# Patient Record
Sex: Female | Born: 2000 | Race: Black or African American | Hispanic: No | Marital: Single
Health system: Southern US, Academic
[De-identification: ages and names within clinical notes are randomized; demographics above are authoritative.]

## PROBLEM LIST (undated history)

## (undated) ENCOUNTER — Ambulatory Visit: Payer: PRIVATE HEALTH INSURANCE

## (undated) ENCOUNTER — Encounter

## (undated) ENCOUNTER — Ambulatory Visit

## (undated) ENCOUNTER — Telehealth

## (undated) ENCOUNTER — Ambulatory Visit: Payer: PRIVATE HEALTH INSURANCE | Attending: Family | Primary: Family

## (undated) ENCOUNTER — Ambulatory Visit: Attending: Pharmacist | Primary: Pharmacist

## (undated) ENCOUNTER — Ambulatory Visit: Attending: Internal Medicine | Primary: Internal Medicine

## (undated) DIAGNOSIS — I889 Nonspecific lymphadenitis, unspecified: Secondary | ICD-10-CM

## (undated) DIAGNOSIS — J45909 Unspecified asthma, uncomplicated: Secondary | ICD-10-CM

## (undated) DIAGNOSIS — L309 Dermatitis, unspecified: Secondary | ICD-10-CM

## (undated) DIAGNOSIS — M359 Systemic involvement of connective tissue, unspecified: Secondary | ICD-10-CM

## (undated) DIAGNOSIS — R519 Headache, unspecified: Secondary | ICD-10-CM

## (undated) HISTORY — DX: Headache, unspecified: R51.9

## (undated) MED ORDER — MOMETASONE 0.1 % TOPICAL OINTMENT: g | 11 refills | 0 days | Status: CN

## (undated) MED ORDER — ALBUTEROL SULFATE HFA 90 MCG/ACTUATION AEROSOL INHALER: 2 | 0 refills | 0 days

## (undated) MED ORDER — EPINEPHRINE 0.3 MG/0.3 ML INJECTION, AUTO-INJECTOR: 0 mg | each | Freq: Once | 2 refills | 2 days

---
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
## 2021-01-04 MED FILL — EMGALITY PEN 120 MG/ML SUBCUTANEOUS PEN INJECTOR: SUBCUTANEOUS | 28 days supply | Qty: 2 | Fill #0

## 2021-02-24 DIAGNOSIS — L209 Atopic dermatitis, unspecified: Principal | ICD-10-CM

## 2021-02-24 MED ORDER — TRIAMCINOLONE ACETONIDE 0.1 % TOPICAL OINTMENT
Freq: Two times a day (BID) | TOPICAL | 2 refills | 0.00000 days
Start: 2021-02-24 — End: ?

## 2021-02-24 MED ORDER — CLOBETASOL 0.05 % TOPICAL OINTMENT
Freq: Two times a day (BID) | TOPICAL | 6 refills | 0.00000 days
Start: 2021-02-24 — End: 2021-03-26

## 2021-02-24 MED ORDER — MOMETASONE 0.1 % TOPICAL OINTMENT
1 refills | 0 days
Start: 2021-02-24 — End: ?

## 2021-02-24 MED ORDER — EUCRISA 2 % TOPICAL OINTMENT
5 refills | 0.00000 days
Start: 2021-02-24 — End: ?

## 2021-02-27 MED ORDER — TRIAMCINOLONE ACETONIDE 0.1 % TOPICAL OINTMENT
Freq: Two times a day (BID) | TOPICAL | 2 refills | 0.00000 days
Start: 2021-02-27 — End: ?

## 2021-02-27 MED ORDER — EUCRISA 2 % TOPICAL OINTMENT
5 refills | 0.00000 days
Start: 2021-02-27 — End: ?

## 2021-02-27 MED ORDER — MOMETASONE 0.1 % TOPICAL OINTMENT
1 refills | 0 days
Start: 2021-02-27 — End: ?

## 2021-02-27 MED FILL — SUMATRIPTAN 100 MG TABLET: ORAL | 5 days supply | Qty: 10 | Fill #1

## 2021-02-27 MED FILL — EMGALITY PEN 120 MG/ML SUBCUTANEOUS PEN INJECTOR: SUBCUTANEOUS | 28 days supply | Qty: 1 | Fill #0

## 2021-02-28 IMAGING — CT CT HEAD W/O CM
4 series · 16 of 47 positions shown, 18 images · non-contrast
Comparison: None.

CLINICAL DATA: Altered mental status.

EXAM:
CT HEAD WITHOUT CONTRAST
TECHNIQUE: Contiguous axial images were obtained from the base of the skull
through the vertex without intravenous contrast.

[Series 3: head without · axial · non-contrast · 0.40mm/px · z∈[+1260,+1380]mm · 7 of 34 slices shown, 9 images]
[im 5/34  brain]
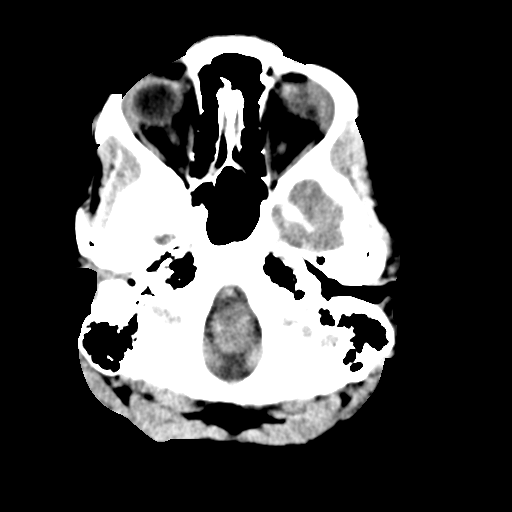
[im 5/34  bone]
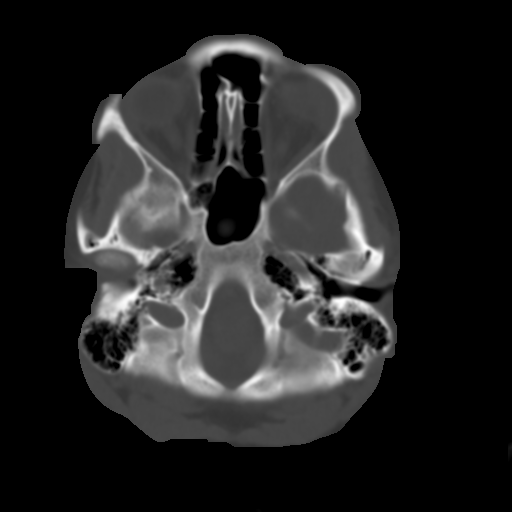
[im 9/34  brain]
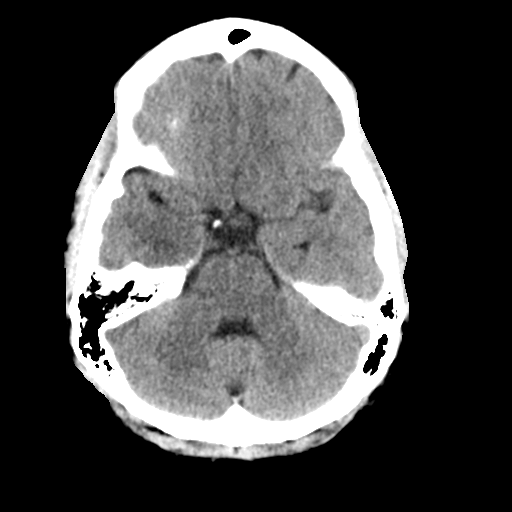
[im 13/34  brain]
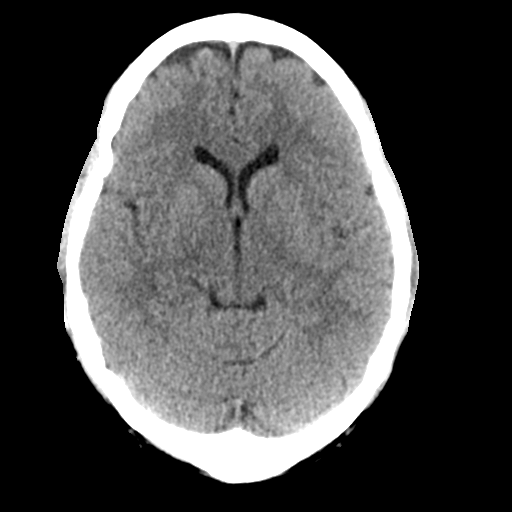
[im 17/34  brain]
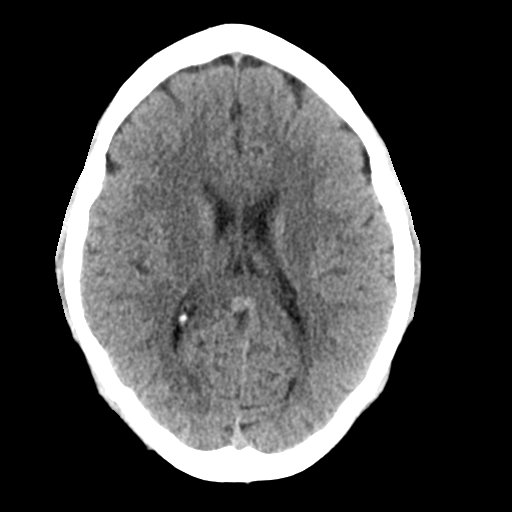
[im 21/34  brain]
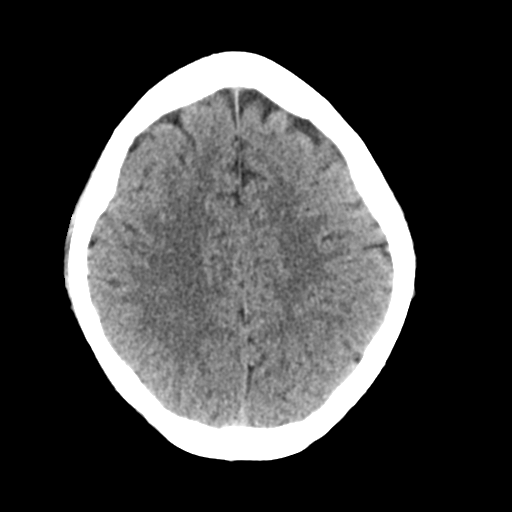
[im 21/34  bone]
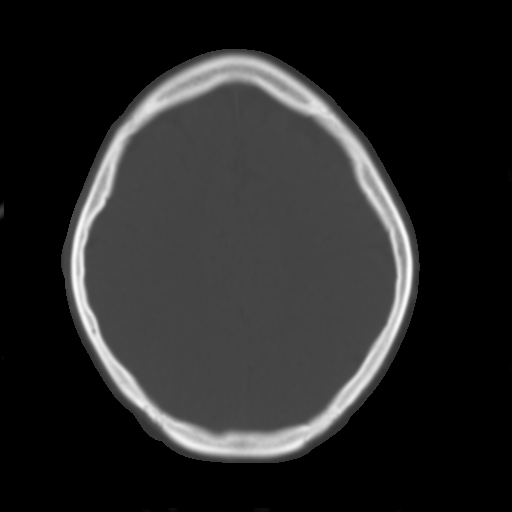
[im 25/34  brain]
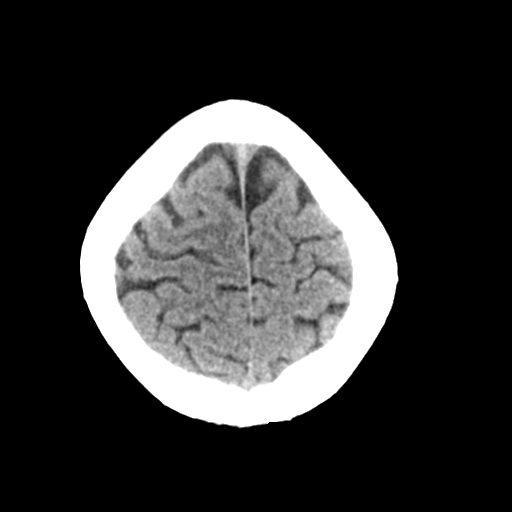
[im 29/34  brain]
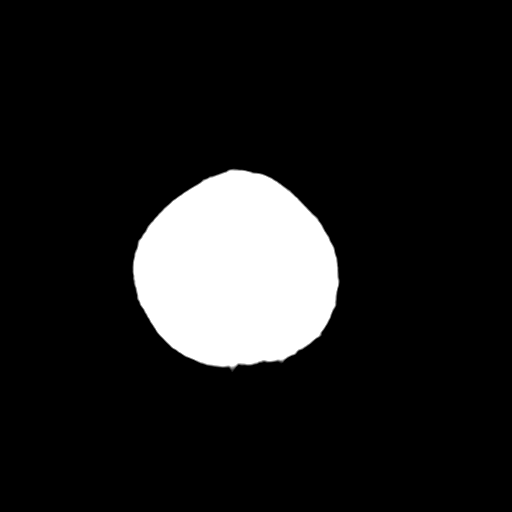

[Series 4: head bone · axial · 0.40mm/px · z∈[+1256,+1288]mm · 3 of 84 slices shown]
[im 9/84  bone]
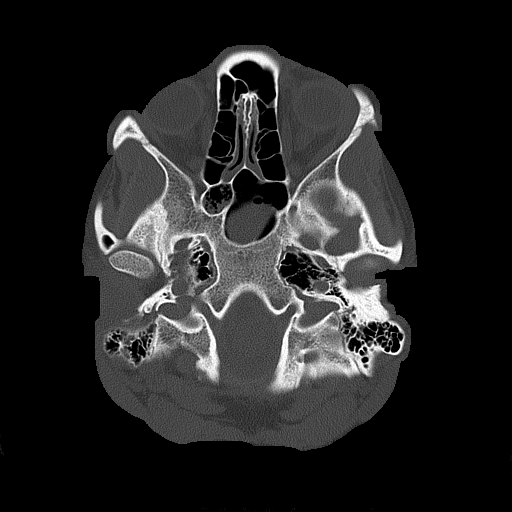
[im 17/84  bone]
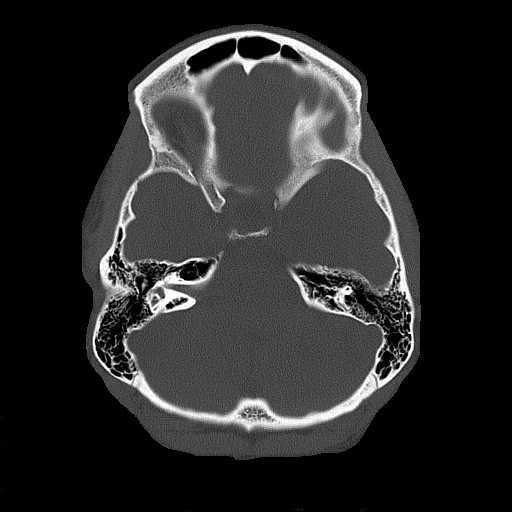
[im 25/84  bone]
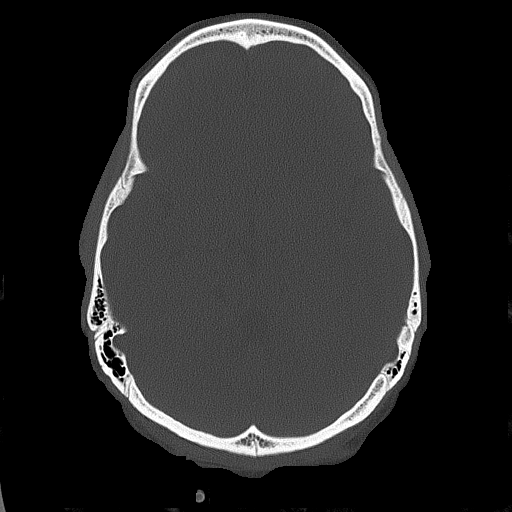

[Series 5: head without cor · coronal · non-contrast · 0.32mm/px · 3 of 67 slices shown]
[im 23/67  brain]
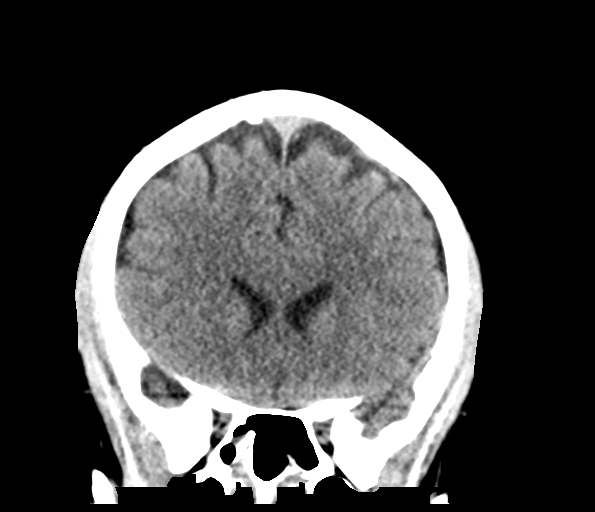
[im 30/67  brain]
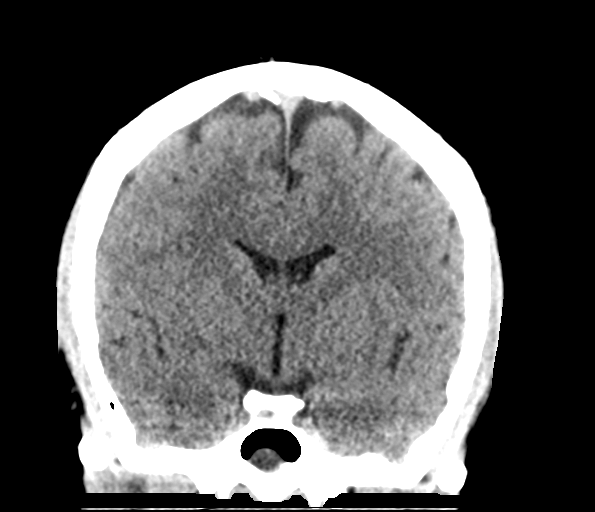
[im 37/67  brain]
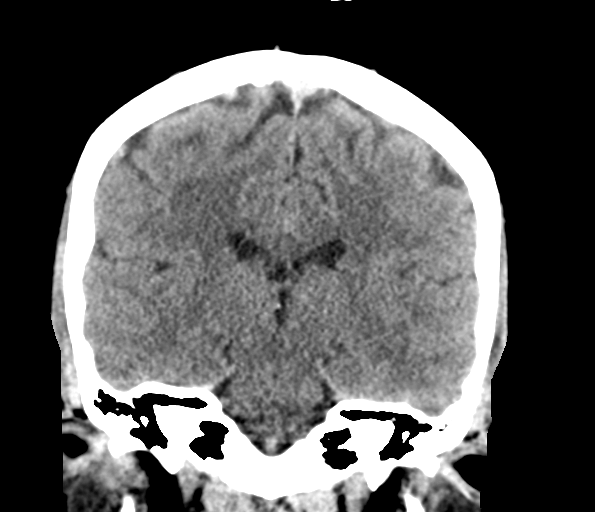

[Series 6: head without sag · sagittal · non-contrast · 0.32mm/px · 3 of 67 slices shown]
[im 23/67  brain]
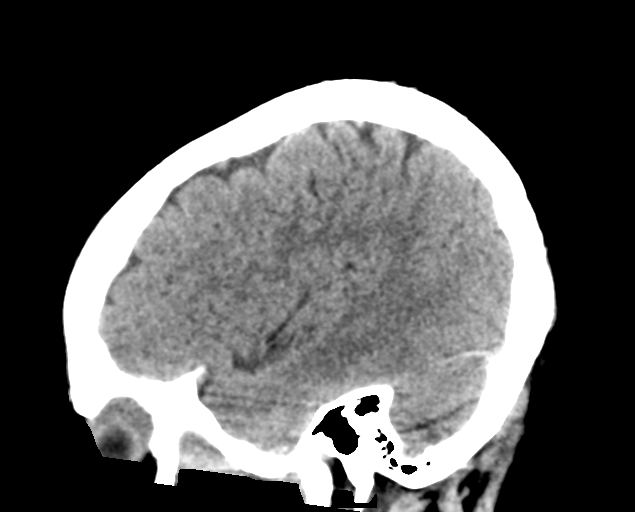
[im 34/67  brain]
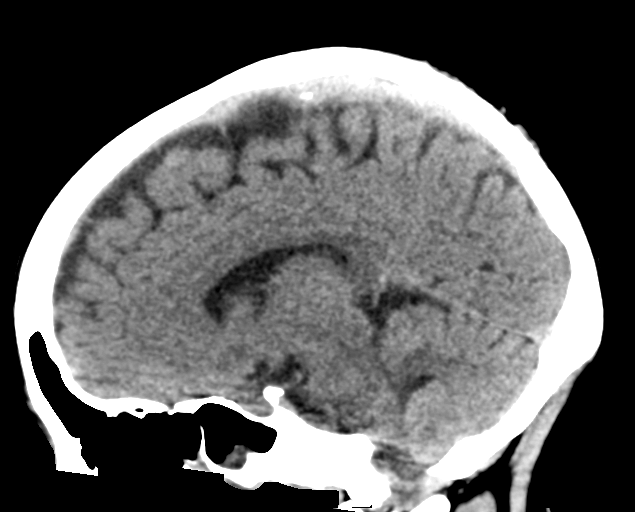
[im 45/67  brain]
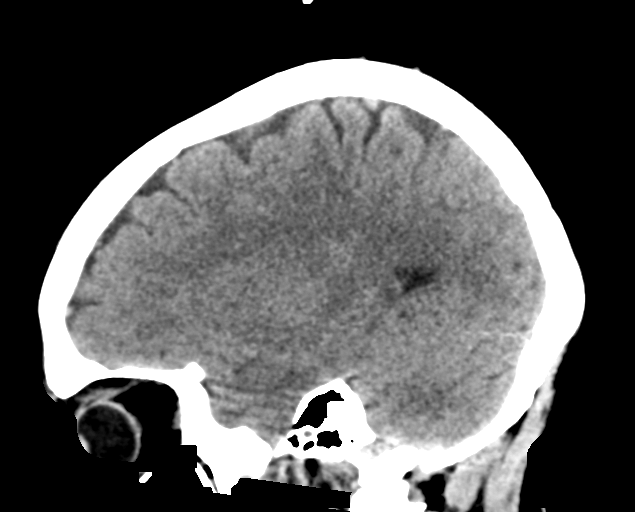

[16 of 47 positions shown; findings below may reference images not displayed]

FINDINGS: Brain: No acute infarct, hemorrhage, or mass lesion is present. The
ventricles are of normal size. No significant extraaxial fluid
collection is present. No significant extraaxial fluid collection is
present. The brainstem and cerebellum are within normal limits.

Vascular: No hyperdense vessel or unexpected calcification.

Skull: Calvarium is intact. No focal lytic or blastic lesions are
present.

Sinuses/Orbits: Fluid is present in the sphenoid sinuses
bilaterally. The paranasal sinuses and mastoid air cells are
otherwise clear. The globes and orbits are within normal limits.
IMPRESSION: 1. Normal CT appearance of the brain.
2. Bilateral sphenoid sinus disease.

## 2021-06-27 NOTE — Progress Notes (Deleted)
? ?NEUROLOGY FOLLOW UP OFFICE NOTE ? ?Yvette Paul ?[id number] ? ?Assessment/Plan:  ? 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  Less likely seizure ?Major depressive disorder, recurrent ?  ?  ?Migraine prevention:  Start Emgality every 28 days ?Migraine rescue:  sumatriptan 100mg  ?Limit use of pain relievers to no more than 2 days out of week to prevent risk of rebound or medication-overuse headache. ?Keep headache diary ?Discussed lifestyle modification in school ?Follow up 6 months. ? ?Subjective:  ?Yvette Paul is an 21 year old right-handed female who follows up for migraines   ?  ?UPDATE: ?Started Emgality in September. ?Headaches are daily. ?Frequency of abortive medication: rarely ?Rescue protocol:  laying down ?Current NSAIDS/analgesics:  ibuprofen 800mg  (rarely) ?Current triptans:  sumatriptan 100mg  ?Current ergotamine:  none ?Current anti-emetic:  none ?Current muscle relaxants:  none ?Current Antihypertensive medications:  none ?Current Antidepressant medications:  none ?Current Anticonvulsant medications:  none ?Current anti-CGRP:  none ?Current Vitamins/Herbal/Supplements:  none ?Current Antihistamines/Decongestants:  Allegra ?Other therapy:  none ?Hormone/birth control:  none ?  ?Caffeine:  rarely ?Diet:  Juice.  No soda. Skips meals ?Exercise:  Not routine ?Depression:  sometimes; Anxiety:  yes.  Increased stress starting college. ?Other pain:  none  ?Sleep hygiene:  Poor.   ?  ?HISTORY:  ?She has had headaches since 2015.  The are severe pressure headache that starts in mid-frontal region and spreads across the forehead. They are typically associated with dizziness photophobia and phonophobia, lasting 1 to 2 hours and occurring daily.   She takes ibuprofen and Benadryl with either Tylenol or Excedrin almost daily.  Since onset of COVID-19 pandemic, she has had increased stress.  She started having  at least two episodes of intractable headaches in which she "crashes" staying in bed and cannot eat or function.  She may have associated symptoms of dissociation, described as feeling like she is in a dreamlike state.  They may last from 2 days up to 2 weeks.  She has had 3 ED visits last year for intractable headaches.  She was seen in the ED for one of these dissociative episodes in November 2020.  She was evaluated by psychiatry and diagnosed with recurrent major depressive disorder.  She was never treated.  For evaluation of dissociative state, underwent awake and asleep routine EEG on 08/03/2020 which was normal.  She has history of chronic sinusitis.  She reports that she was told by ENT in 2018 that she had a frontal lobe mass that was "too dangerous for surgery".  However, MRI of the brain from 01/09/2019 showed no mass or frontal lobe abnormality.  The previous MRI was not available to compare.  It did reveal sinus disease.  She also reports irregular sleep with snoring and pauses, suspicious for OSA.  She reports that she has an upcoming appointment with ENT for surgery to correct a deviated septum, hoping that it may help with her sleep and headaches.  She has been referred to psychiatry and counseling.   ?  ?  ?Past NSAIDS/analgesics:  Ibuprofen 800mg  daily, Tylenol, Excedrin ?Past abortive triptans:  rizatriptan 10mg  ?Past abortive ergotamine:  none ?Past muscle relaxants:  none ?Past anti-emetic:  none ?Past antihypertensive medications:  propranolol ?Past antidepressant medications:  nortriptyline 25mg   ?Past anticonvulsant medications:  none ?Past anti-CGRP:  none ?Past vitamins/Herbal/Supplements:  none ?Past antihistamines/decongestants:  Zyrtec ?Other past therapies:  none ?  ?   ?Family history of  headache:  Mother had migraines in highschool.  No family history of aneurysms or seizures.  ? ?PAST MEDICAL HISTORY: ?Past Medical History:  ?Diagnosis Date  ? Asthma   ? Autoimmune disease (HCC)   ?  Eczema   ? Eczema   ? Headache   ? Lymphadenitis   ? ? ?MEDICATIONS: ?Current Outpatient Medications on File Prior to Visit  ?Medication Sig Dispense Refill  ? Dupilumab (DUPIXENT) 300 MG/2ML SOPN Inject 2 mLs as directed every 14 (fourteen) days.    ? EPINEPHrine 0.3 mg/0.3 mL IJ SOAJ injection SMARTSIG:0.3 Milligram(s) IM Once PRN    ? fexofenadine-pseudoephedrine (ALLEGRA-D 24) 180-240 MG 24 hr tablet Take by mouth.    ? Galcanezumab-gnlm (EMGALITY) 120 MG/ML SOAJ Inject 120 mg into the skin every 28 (twenty-eight) days. 1.12 mL 5  ? ibuprofen (ADVIL) 800 MG tablet Take 800 mg by mouth 3 (three) times daily.    ? mometasone (ELOCON) 0.1 % ointment Apply topically.    ? nortriptyline (PAMELOR) 25 MG capsule Take 1 capsule (25 mg total) by mouth at bedtime. (Patient not taking: Reported on 12/21/2020) 30 capsule 5  ? propranolol (INDERAL) 10 MG tablet Take 10 mg by mouth 2 (two) times daily. (Patient not taking: Reported on 12/21/2020)    ? rizatriptan (MAXALT) 10 MG tablet Take 1 tablet earliest onset of headache.  May repeat in 2 hours if needed.  Maximum 2 tablets in 24 hours. (Patient not taking: Reported on 12/21/2020) 10 tablet 5  ? SUMAtriptan (IMITREX) 100 MG tablet Take 1 tablet earliest onset of migraine.  May repeat in 2 hours if headache persists or recurs.  Maximum 2 tablets in 24 hours. 10 tablet 5  ? triamcinolone ointment (KENALOG) 0.1 % Apply 1 application topically 2 (two) times daily. (Patient not taking: Reported on 12/21/2020)    ? ?No current facility-administered medications on file prior to visit.  ? ? ?ALLERGIES: ?Allergies  ?Allergen Reactions  ? Other Anaphylaxis  ?  All nuts  ? ? ?FAMILY HISTORY: ?Family History  ?Problem Relation Age of Onset  ? Cancer - Colon Maternal Grandmother   ? Congenital heart disease Maternal Grandmother   ? Hypertension Maternal Grandmother   ? Hypercholesterolemia Maternal Grandmother   ? Neurofibromatosis Paternal Grandmother   ? Cancer - Other Paternal  Grandmother   ? Cancer - Other Paternal Grandfather   ? ? ?  ?Objective:  ?*** ?General: No acute distress.  Patient appears ***-groomed.   ?Head:  Normocephalic/atraumatic ?Eyes:  Fundi examined but not visualized ?Neck: supple, no paraspinal tenderness, full range of motion ?Heart:  Regular rate and rhythm ?Lungs:  Clear to auscultation bilaterally ?Back: No paraspinal tenderness ?Neurological Exam: alert and oriented to person, place, and time.  Speech fluent and not dysarthric, language intact.  CN II-XII intact. Bulk and tone normal, muscle strength 5/5 throughout.  Sensation to light touch intact.  Deep tendon reflexes 2+ throughout, toes downgoing.  Finger to nose testing intact.  Gait normal, Romberg negative. ? ? ?Shon Millet, DO ? ?CC: Audrea Muscat, NP ? ? ? ? ? ? ?

## 2021-06-29 ENCOUNTER — Ambulatory Visit: Payer: Medicaid Other | Admitting: Neurology

## 2021-08-25 ENCOUNTER — Ambulatory Visit: Payer: Medicaid Other | Admitting: Neurology

## 2022-02-20 DIAGNOSIS — Z7689 Persons encountering health services in other specified circumstances: Principal | ICD-10-CM

## 2022-02-22 DIAGNOSIS — R062 Wheezing: Principal | ICD-10-CM

## 2022-02-28 ENCOUNTER — Ambulatory Visit: Admit: 2022-02-28 | Discharge: 2022-03-01 | Payer: PRIVATE HEALTH INSURANCE

## 2022-02-28 DIAGNOSIS — Z113 Encounter for screening for infections with a predominantly sexual mode of transmission: Principal | ICD-10-CM

## 2022-02-28 DIAGNOSIS — Z8041 Family history of malignant neoplasm of ovary: Principal | ICD-10-CM

## 2022-02-28 DIAGNOSIS — Z1379 Encounter for other screening for genetic and chromosomal anomalies: Principal | ICD-10-CM

## 2022-02-28 DIAGNOSIS — Z1239 Encounter for other screening for malignant neoplasm of breast: Principal | ICD-10-CM

## 2022-02-28 DIAGNOSIS — Z7689 Persons encountering health services in other specified circumstances: Principal | ICD-10-CM

## 2022-02-28 DIAGNOSIS — N6452 Nipple discharge: Principal | ICD-10-CM

## 2022-02-28 DIAGNOSIS — Z30011 Encounter for initial prescription of contraceptive pills: Principal | ICD-10-CM

## 2022-02-28 DIAGNOSIS — Z842 Family history of other diseases of the genitourinary system: Principal | ICD-10-CM

## 2022-02-28 DIAGNOSIS — L680 Hirsutism: Principal | ICD-10-CM

## 2022-02-28 DIAGNOSIS — Z01419 Encounter for gynecological examination (general) (routine) without abnormal findings: Principal | ICD-10-CM

## 2022-02-28 DIAGNOSIS — N946 Dysmenorrhea, unspecified: Principal | ICD-10-CM

## 2022-02-28 DIAGNOSIS — R102 Pelvic and perineal pain: Principal | ICD-10-CM

## 2022-03-01 MED ORDER — METRONIDAZOLE 500 MG TABLET
ORAL_TABLET | Freq: Two times a day (BID) | ORAL | 0 refills | 7 days | Status: CP
Start: 2022-03-01 — End: ?

## 2022-03-01 MED ORDER — FLUCONAZOLE 150 MG TABLET
ORAL_TABLET | ORAL | 0 refills | 6 days | Status: CP
Start: 2022-03-01 — End: 2022-03-07

## 2022-03-06 DIAGNOSIS — Z842 Family history of other diseases of the genitourinary system: Principal | ICD-10-CM

## 2022-03-06 DIAGNOSIS — N946 Dysmenorrhea, unspecified: Principal | ICD-10-CM

## 2022-03-29 ENCOUNTER — Ambulatory Visit: Admit: 2022-03-29 | Discharge: 2022-03-30 | Payer: PRIVATE HEALTH INSURANCE

## 2022-03-29 DIAGNOSIS — N946 Dysmenorrhea, unspecified: Principal | ICD-10-CM

## 2022-03-29 DIAGNOSIS — E282 Polycystic ovarian syndrome: Principal | ICD-10-CM

## 2022-03-29 DIAGNOSIS — N926 Irregular menstruation, unspecified: Principal | ICD-10-CM

## 2022-03-29 MED ORDER — METFORMIN ER 500 MG TABLET,EXTENDED RELEASE 24 HR
ORAL_TABLET | Freq: Every day | ORAL | 3 refills | 90 days | Status: CP
Start: 2022-03-29 — End: 2023-03-29

## 2022-03-29 MED ORDER — DROSPIRENONE (CONTRACEPTIVE) 4 MG (28) TABLET
ORAL_TABLET | Freq: Every day | ORAL | 11 refills | 0 days | Status: CP
Start: 2022-03-29 — End: ?

## 2022-05-23 DIAGNOSIS — R062 Wheezing: Principal | ICD-10-CM

## 2022-05-23 DIAGNOSIS — N926 Irregular menstruation, unspecified: Principal | ICD-10-CM

## 2022-05-23 DIAGNOSIS — E282 Polycystic ovarian syndrome: Principal | ICD-10-CM

## 2022-05-23 MED ORDER — CETIRIZINE 10 MG TABLET
ORAL_TABLET | Freq: Every day | ORAL | 6 refills | 30.00000 days
Start: 2022-05-23 — End: ?

## 2022-05-23 MED ORDER — DROSPIRENONE (CONTRACEPTIVE) 4 MG (28) TABLET
ORAL_TABLET | Freq: Every day | ORAL | 11 refills | 0 days | Status: CP
Start: 2022-05-23 — End: ?

## 2022-05-23 MED ORDER — ALBUTEROL SULFATE HFA 90 MCG/ACTUATION AEROSOL INHALER
RESPIRATORY_TRACT | 0 refills | 0.00000 days | PRN
Start: 2022-05-23 — End: 2023-05-23

## 2022-07-09 ENCOUNTER — Ambulatory Visit: Admit: 2022-07-09 | Discharge: 2022-07-10 | Payer: PRIVATE HEALTH INSURANCE

## 2022-07-09 DIAGNOSIS — N898 Other specified noninflammatory disorders of vagina: Principal | ICD-10-CM

## 2022-07-09 DIAGNOSIS — N760 Acute vaginitis: Principal | ICD-10-CM

## 2022-07-09 DIAGNOSIS — Z113 Encounter for screening for infections with a predominantly sexual mode of transmission: Principal | ICD-10-CM

## 2023-01-09 ENCOUNTER — Encounter: Admit: 2023-01-09 | Discharge: 2023-01-09 | Payer: PRIVATE HEALTH INSURANCE

## 2023-01-09 ENCOUNTER — Ambulatory Visit: Admit: 2023-01-09 | Discharge: 2023-01-10 | Payer: PRIVATE HEALTH INSURANCE

## 2023-01-09 DIAGNOSIS — N760 Acute vaginitis: Principal | ICD-10-CM

## 2023-01-09 DIAGNOSIS — Z30011 Encounter for initial prescription of contraceptive pills: Principal | ICD-10-CM

## 2023-01-09 DIAGNOSIS — Z3009 Encounter for other general counseling and advice on contraception: Principal | ICD-10-CM

## 2023-01-09 MED ORDER — DROSPIRENONE 3 MG-ETHINYL ESTRADIOL 0.02 MG TABLET
ORAL_TABLET | Freq: Every day | ORAL | 3 refills | 84 days | Status: CP
Start: 2023-01-09 — End: 2024-01-09

## 2023-01-10 MED ORDER — FLUCONAZOLE 150 MG TABLET
ORAL_TABLET | ORAL | 0 refills | 6 days | Status: CP
Start: 2023-01-10 — End: 2023-01-16
